# Patient Record
Sex: Male | Born: 1980 | Race: Black or African American | Hispanic: No | Marital: Married | Smoking: Current every day smoker
Health system: Southern US, Community
[De-identification: ages and names within clinical notes are randomized; demographics above are authoritative.]

---

## 2016-11-14 ENCOUNTER — Emergency Department (HOSPITAL_COMMUNITY): Payer: Worker's Compensation

## 2016-11-14 ENCOUNTER — Encounter (HOSPITAL_COMMUNITY): Payer: Self-pay | Admitting: Emergency Medicine

## 2016-11-14 ENCOUNTER — Emergency Department (HOSPITAL_COMMUNITY)
Admission: EM | Admit: 2016-11-14 | Discharge: 2016-11-14 | Disposition: A | Discharge status: Home / Self Care | Payer: Worker's Compensation | Attending: Emergency Medicine | Admitting: Emergency Medicine

## 2016-11-14 DIAGNOSIS — F17200 Nicotine dependence, unspecified, uncomplicated: Secondary | ICD-10-CM | POA: Insufficient documentation

## 2016-11-14 DIAGNOSIS — W228XXA Striking against or struck by other objects, initial encounter: Secondary | ICD-10-CM | POA: Insufficient documentation

## 2016-11-14 DIAGNOSIS — Y9383 Activity, rough housing and horseplay: Secondary | ICD-10-CM | POA: Insufficient documentation

## 2016-11-14 DIAGNOSIS — Y929 Unspecified place or not applicable: Secondary | ICD-10-CM | POA: Insufficient documentation

## 2016-11-14 DIAGNOSIS — S63502A Unspecified sprain of left wrist, initial encounter: Secondary | ICD-10-CM

## 2016-11-14 DIAGNOSIS — S6992XA Unspecified injury of left wrist, hand and finger(s), initial encounter: Secondary | ICD-10-CM | POA: Diagnosis present

## 2016-11-14 DIAGNOSIS — Y999 Unspecified external cause status: Secondary | ICD-10-CM | POA: Diagnosis not present

## 2016-11-14 NOTE — ED Provider Notes (Signed)
[facility]-EMER[facility]ENCY DEPT Provider Note   CSN: [id number] Arrival date & time: 11/14/16  1020     History   Chief Complaint Chief Complaint  Patient presents with  . Wrist Pain    left    HPI Victor Burton is a 36 y.o. male. Chief complaint is wrist pain  HPI: 36 year old male. States he was play boxing with a friend. He struck a stationary object with his left hand in a fist. He has pain on the radial side of his left wrist. It is painful to move.  History reviewed. No pertinent past medical history.  There are no active problems to display for this patient.   History reviewed. No pertinent surgical history.     Home Medications    Prior to Admission medications   Not on File    Family History No family history on file.  Social History Social History  Substance Use Topics  . Smoking status: Current Every Day Smoker  . Smokeless tobacco: Current User  . Alcohol use Yes     Allergies   Patient has no allergy information on record.   Review of Systems Review of Systems  Musculoskeletal:       Left wrist pain. No hand pain or swelling. No elbow or shoulder pain.   review of systems otherwise negative.   Physical Exam Updated Vital Signs BP (!) 145/92 (BP Location: Right Arm)   Pulse 61   Temp 98.3 F (36.8 C) (Oral)   Resp 16   Ht 6' (1.829 m)   Wt 86.2 kg (190 lb)   SpO2 96%   BMI 25.77 kg/m   Physical Exam  Musculoskeletal:  Tenderness to palpation over the dorsum of the wrist on the radial aspect. Some tenderness in the snuffbox. Full range of motion of wrist and digits. No swelling to the hand or wrist.     ED Treatments / Results  Labs (all labs ordered are listed, but only abnormal results are displayed) Labs Reviewed - No data to display  EKG  EKG Interpretation None       Radiology Dg Wrist Complete Left  Result Date: 11/14/2016 CLINICAL DATA:  Left wrist pain since yesterday with slight swelling. Fell on the wrist 2 days  ago. EXAM: LEFT WRIST - COMPLETE 3+ VIEW COMPARISON:  None. FINDINGS: There is no evidence of fracture or dislocation. There is no evidence of arthropathy or other focal bone abnormality. Soft tissues are unremarkable. IMPRESSION: Normal examination. Electronically Signed   By: Beckie Salts M.D.   On: 11/14/2016 12:01    Procedures Procedures (including critical care time)  Medications Ordered in ED Medications - No data to display   Initial Impression / Assessment and Plan / ED Course  I have reviewed the triage vital signs and the nursing notes.  Pertinent labs & imaging results that were available during my care of the patient were reviewed by me and considered in my medical decision making (see chart for details).     Exam not highly concerning for fracture other than snuffbox tenderness. X-ray of the wrist is negative. I discussed with him the possibility of an occult navicular fracture. Velcro splint was placed. Ask him to wear this and avoid lifting more than 5-10 pounds until pain free. If more than 7-10 days go by and still painful he should present for repeat x-ray to rule out occult navicular fracture.  Final Clinical Impressions(s) / ED Diagnoses   Final diagnoses:  Sprain of left wrist, initial  encounter    New Prescriptions New Prescriptions   No medications on file     Rolland Porter, MD 11/14/16 1255

## 2016-11-14 NOTE — Discharge Instructions (Signed)
Your x-rays of the wrist are negative. There is no obvious fracture. Some small bones in the wrist do not show up fractured on initial x-rays. If you still have pain in 7-10 days, you need to have a repeat x-ray of your wrist to rule out an occult fracture. Wear the splint at all times. Remove to apply ice for 20 minutes 3 times per day. No lifting over 5-10 pounds with left arm until pain free. This may take up to a week.

## 2016-11-14 NOTE — ED Triage Notes (Signed)
Pt. Stated something fell on my left wrist on Friday  And its gotten worse.

## 2016-11-22 ENCOUNTER — Encounter (HOSPITAL_COMMUNITY): Payer: Self-pay | Admitting: Emergency Medicine

## 2016-11-22 ENCOUNTER — Emergency Department (HOSPITAL_COMMUNITY)
Admission: EM | Admit: 2016-11-22 | Discharge: 2016-11-22 | Disposition: A | Discharge status: Home / Self Care | Payer: Self-pay | Attending: Emergency Medicine | Admitting: Emergency Medicine

## 2016-11-22 DIAGNOSIS — M25532 Pain in left wrist: Secondary | ICD-10-CM | POA: Insufficient documentation

## 2016-11-22 DIAGNOSIS — F17200 Nicotine dependence, unspecified, uncomplicated: Secondary | ICD-10-CM | POA: Insufficient documentation

## 2016-11-22 DIAGNOSIS — Z09 Encounter for follow-up examination after completed treatment for conditions other than malignant neoplasm: Secondary | ICD-10-CM

## 2016-11-22 NOTE — ED Provider Notes (Signed)
  [facility] Provider Note   CSN: [id number] Arrival date & time: 11/22/16  0030     History   Chief Complaint Chief Complaint  Patient presents with  . Follow-up    HPI Victor Burton is a 36 y.o. male.  Patient presents emergency department with chief complaint of needing a note for work. He states that he sprained his wrist a little over week ago. States that his work will not let him return without a note. He states that he has no pain in his wrist. He has no symptoms whatsoever.   The history is provided by the patient. No language interpreter was used.    History reviewed. No pertinent past medical history.  There are no active problems to display for this patient.   History reviewed. No pertinent surgical history.     Home Medications    Prior to Admission medications   Not on File    Family History No family history on file.  Social History Social History  Substance Use Topics  . Smoking status: Current Every Day Smoker  . Smokeless tobacco: Current User  . Alcohol use Yes     Allergies   Patient has no known allergies.   Review of Systems Review of Systems  All other systems reviewed and are negative.    Physical Exam Updated Vital Signs BP (!) 141/105 (BP Location: Right Arm)   Pulse 70   Temp 98.1 F (36.7 C) (Oral)   Resp 16   Ht 6' (1.829 m)   Wt 86.2 kg (190 lb)   SpO2 100%   BMI 25.77 kg/m   Physical Exam  Nursing note and vitals reviewed.  Constitutional: Pt appears well-developed and well-nourished. No distress.  HENT:  Head: Normocephalic and atraumatic.  Eyes: Conjunctivae are normal.  Neck: Normal range of motion.  Cardiovascular: Normal rate, regular rhythm. Intact distal pulses.   Capillary refill < 3 sec.  Pulmonary/Chest: Effort normal and breath sounds normal.  Musculoskeletal:  Left wrist Pt exhibits no ttp, specifically no snuffbox tenderness.   ROM: 5/5  Strength: 5/5  Neurological: Pt  is  alert. Coordination normal.  Sensation: 5/5 Skin: Skin is warm and dry. Pt is not diaphoretic.  No evidence of open wound or skin tenting Psychiatric: Pt has a normal mood and affect.    ED Treatments / Results  Labs (all labs ordered are listed, but only abnormal results are displayed) Labs Reviewed - No data to display  EKG  EKG Interpretation None       Radiology No results found.  Procedures Procedures (including critical care time)  Medications Ordered in ED Medications - No data to display   Initial Impression / Assessment and Plan / ED Course  I have reviewed the triage vital signs and the nursing notes.  Pertinent labs & imaging results that were available during my care of the patient were reviewed by me and considered in my medical decision making (see chart for details).     Patient with wrist injury a little over week ago. No complaints now. He needs a return to work note. He has no snuffbox tenderness. Doubt occult navicular fracture. Will discharge with return precautions as needed.  Final Clinical Impressions(s) / ED Diagnoses   Final diagnoses:  Follow up    New Prescriptions New Prescriptions   No medications on file     Roxy Horseman, Cordelia Poche 11/22/16 0217    Gilda Crease, MD 11/22/16 [phone]

## 2016-11-22 NOTE — ED Triage Notes (Signed)
Pt states he was seen here 1 week ago for a wrist sprain, states he needs a note releasing him to return to work. Denies pain/issues with wrist.

## 2016-11-22 NOTE — ED Notes (Signed)
Pt here for a work release note because his job will not allow him to return without one. The original work note does not have a return to work date.

## 2018-11-06 IMAGING — CR DG WRIST COMPLETE 3+V*L*
4 series · 4 of 4 positions shown · non-contrast
Comparison: None.

CLINICAL DATA: Left wrist pain since yesterday with slight
swelling. Fell on the wrist 2 days ago.

EXAM:
LEFT WRIST - COMPLETE 3+ VIEW

[wrist pa]
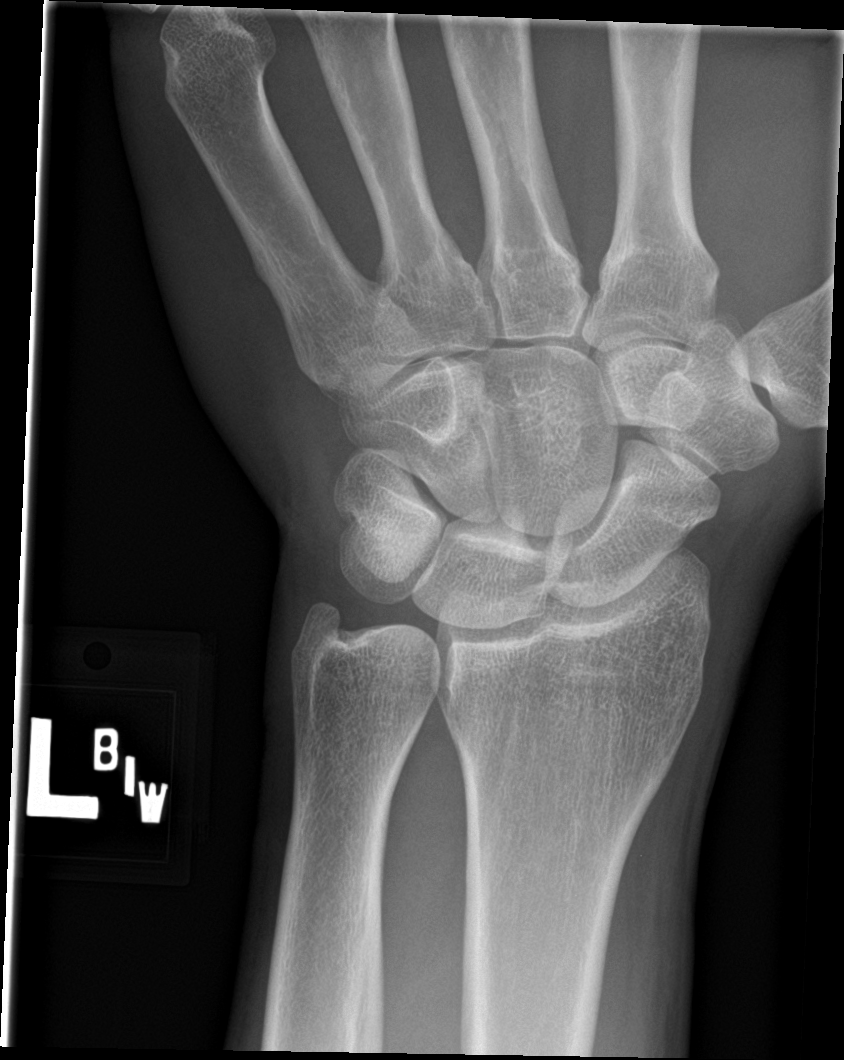

[wrist obl]
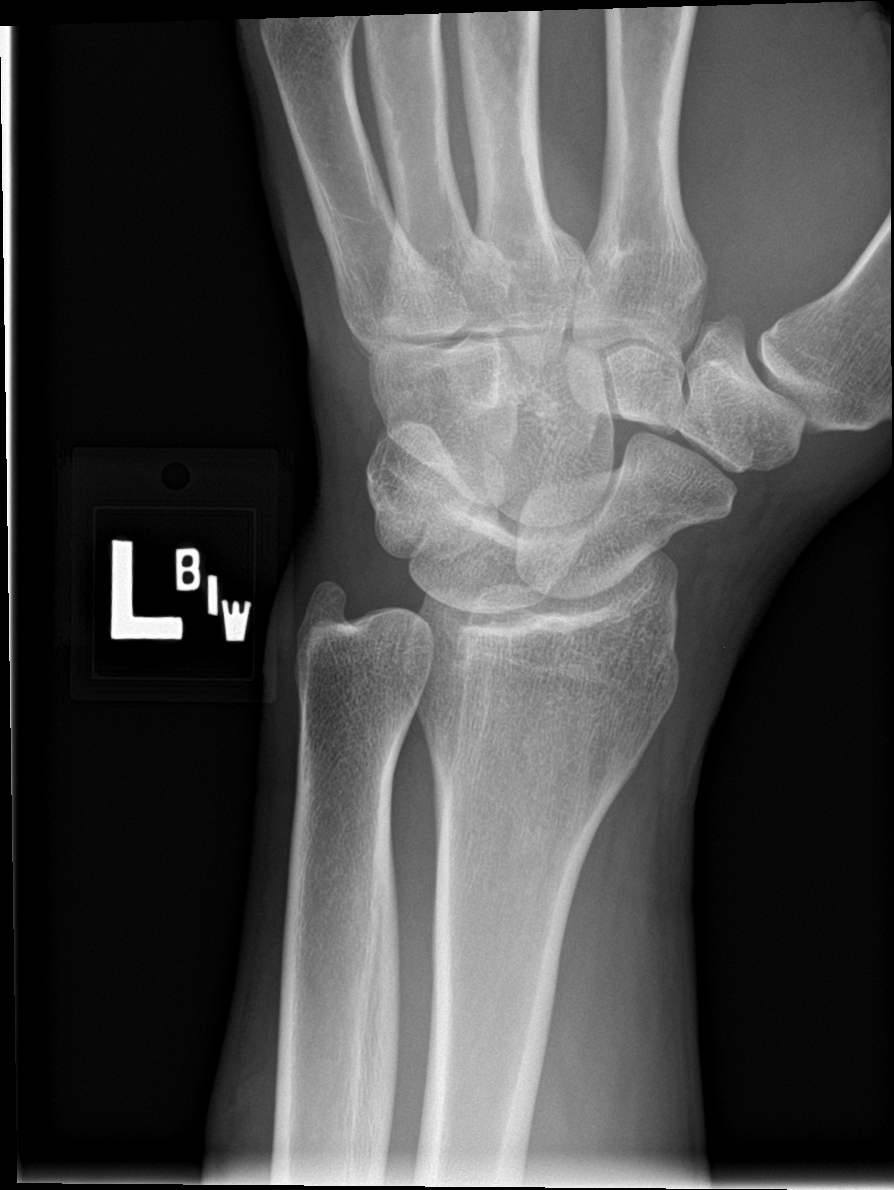

[wrist lat]
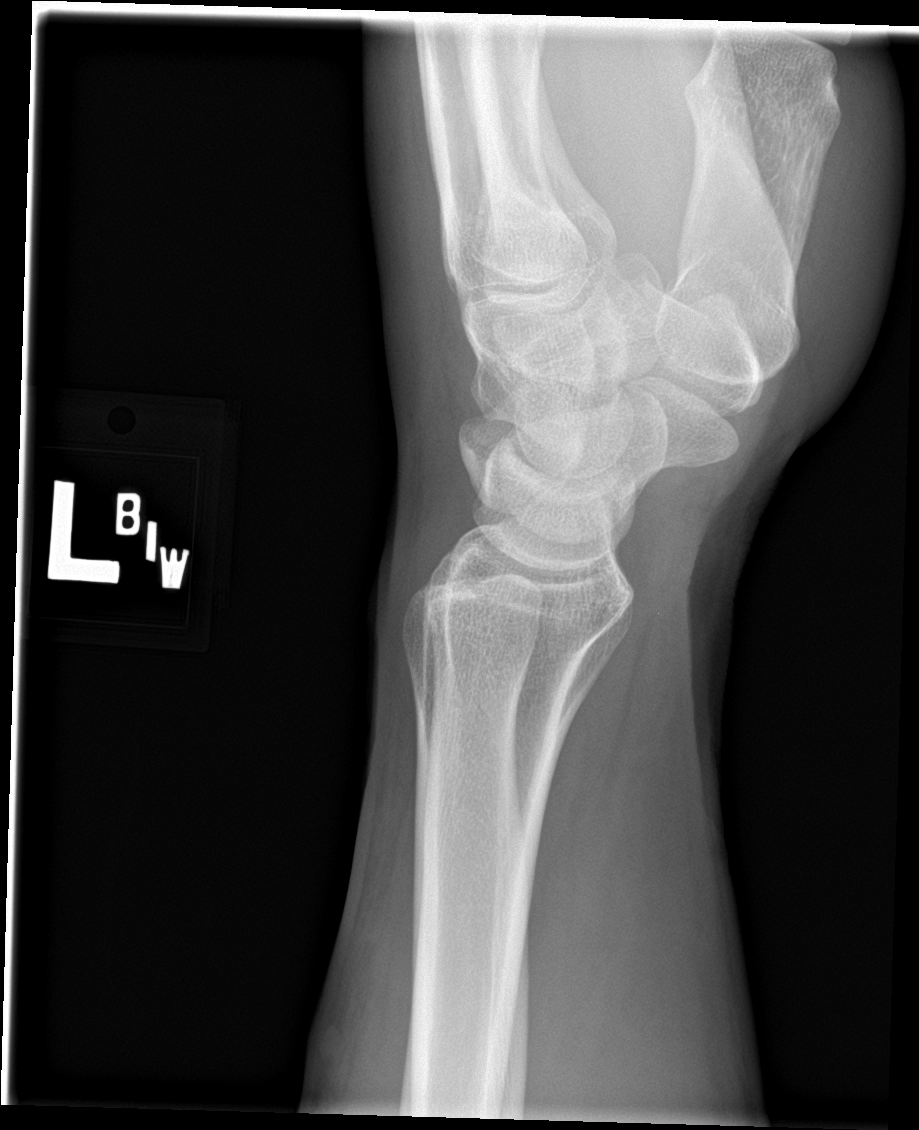

[wrist navicular]
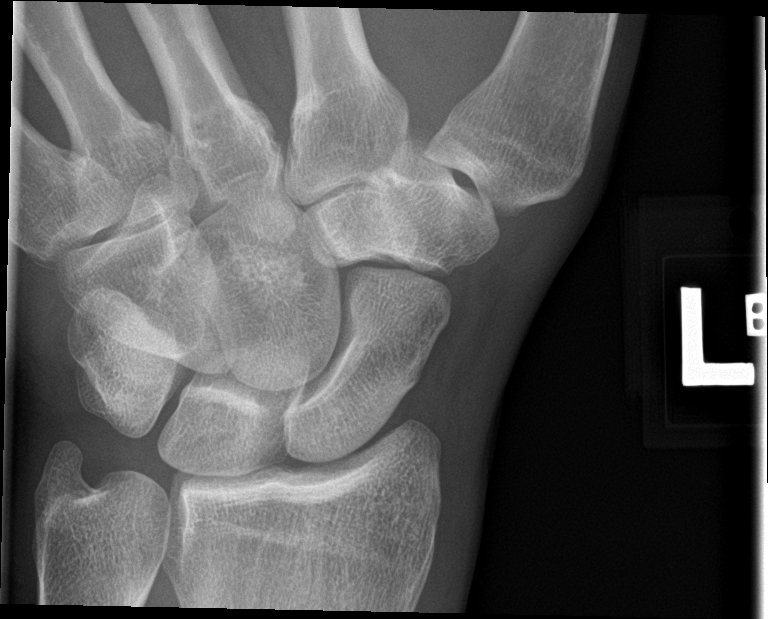

[4 of 4 positions shown; findings below may reference images not displayed]

FINDINGS: There is no evidence of fracture or dislocation. There is no
evidence of arthropathy or other focal bone abnormality. Soft
tissues are unremarkable.
IMPRESSION: Normal examination.

## 2021-01-23 ENCOUNTER — Encounter (HOSPITAL_COMMUNITY): Payer: Self-pay | Admitting: Emergency Medicine

## 2021-01-23 ENCOUNTER — Emergency Department (HOSPITAL_COMMUNITY)
Admission: EM | Admit: 2021-01-23 | Discharge: 2021-01-24 | Disposition: A | Discharge status: Home / Self Care | Payer: 59 | Attending: Emergency Medicine | Admitting: Emergency Medicine

## 2021-01-23 ENCOUNTER — Other Ambulatory Visit: Payer: Self-pay

## 2021-01-23 DIAGNOSIS — F17200 Nicotine dependence, unspecified, uncomplicated: Secondary | ICD-10-CM | POA: Diagnosis not present

## 2021-01-23 DIAGNOSIS — J101 Influenza due to other identified influenza virus with other respiratory manifestations: Secondary | ICD-10-CM | POA: Insufficient documentation

## 2021-01-23 DIAGNOSIS — Z20822 Contact with and (suspected) exposure to covid-19: Secondary | ICD-10-CM | POA: Diagnosis not present

## 2021-01-23 DIAGNOSIS — R197 Diarrhea, unspecified: Secondary | ICD-10-CM | POA: Diagnosis present

## 2021-01-23 NOTE — ED Triage Notes (Signed)
Pt here for flu-like symptoms that started this morning, pt recently travelled to [place], unknown sick contacts. Pt reports some back aches, diarrhea, HA, and chills

## 2021-01-23 NOTE — ED Provider Notes (Signed)
Emergency Medicine Provider Triage Evaluation Note  Victor Burton , a 40 y.o. male  was evaluated in triage.  Pt complains of flu-like symptoms onset this AM. Reports diarrhea, chills, headache. Notes some back ache, but has a degree of this at baseline. No medications PTA. Just travelled back to [place] from [place] after a funeral.  Review of Systems  Positive: As above Negative: As above  Physical Exam  BP (!) 133/96 (BP Location: Left Arm)   Pulse 80   Temp 98.4 F (36.9 C) (Oral)   Resp 18   SpO2 97%  Gen:   Awake, no distress   Resp:  Normal effort  MSK:   Moves extremities without difficulty  Other:  Lungs CTAB. Heart RRR.  Medical Decision Making  Medically screening exam initiated at 11:04 PM.  Appropriate orders placed.  Victor Burton was informed that the remainder of the evaluation will be completed by another provider, this initial triage assessment does not replace that evaluation, and the importance of remaining in the ED until their evaluation is complete.  Flu-like symptoms; respiratory panel pending.   Antony Madura, PA-C 01/23/21 2306    Maia Plan, MD 01/24/21 [phone]

## 2021-01-24 LAB — RESP PANEL BY RT-PCR (FLU A&B, COVID) ARPGX2
Influenza A by PCR: POSITIVE — AB
Influenza B by PCR: NEGATIVE
SARS Coronavirus 2 by RT PCR: NEGATIVE

## 2021-01-24 MED ORDER — OSELTAMIVIR PHOSPHATE 75 MG PO CAPS: 75.0000 mg | ORAL_CAPSULE | Freq: Two times a day (BID) | ORAL | 0 refills | Status: AC

## 2021-01-24 NOTE — ED Provider Notes (Signed)
[facility] EMERGENCY DEPARTMENT Provider Note   CSN: [id number] Arrival date & time: 01/23/21  2223     History Chief Complaint  Patient presents with   Chills   Diarrhea    Victor Burton is a 40 y.o. male.  Pt complains of flu-like symptoms onset this AM. Symptoms constant, unchanged. Reports diarrhea, chills, headache. Notes some back ache, but has a degree of this at baseline. No medications PTA. Just travelled back to [facility] from [place] after a funeral.  The history is provided by the patient. No language interpreter was used.  Diarrhea     History reviewed. No pertinent past medical history.  There are no problems to display for this patient.   History reviewed. No pertinent surgical history.     History reviewed. No pertinent family history.  Social History   Tobacco Use   Smoking status: Every Day   Smokeless tobacco: Current  Substance Use Topics   Alcohol use: Yes   Drug use: No    Home Medications Prior to Admission medications   Medication Sig Start Date End Date Taking? Authorizing Provider  oseltamivir (TAMIFLU) 75 MG capsule Take 1 capsule (75 mg total) by mouth every 12 (twelve) hours. 01/24/21  Yes Antony Madura, PA-C    Allergies    Patient has no known allergies.  Review of Systems   Review of Systems  Gastrointestinal:  Positive for diarrhea.  Ten systems reviewed and are negative for acute change, except as noted in the HPI.    Physical Exam Updated Vital Signs BP (!) 133/96 (BP Location: Left Arm)   Pulse 80   Temp 98.4 F (36.9 C) (Oral)   Resp 18   SpO2 97%   Physical Exam Vitals and nursing note reviewed.  Constitutional:      General: He is not in acute distress.    Appearance: He is well-developed. He is not diaphoretic.     Comments: Nontoxic appearing and in NAD  HENT:     Head: Normocephalic and atraumatic.  Eyes:     General: No scleral icterus.    Conjunctiva/sclera: Conjunctivae normal.   Cardiovascular:     Rate and Rhythm: Normal rate and regular rhythm.     Pulses: Normal pulses.  Pulmonary:     Effort: Pulmonary effort is normal. No respiratory distress.     Breath sounds: No stridor. No wheezing.     Comments: Lungs CTAB. Respirations even and unlabored. Musculoskeletal:        General: Normal range of motion.     Cervical back: Normal range of motion.  Skin:    General: Skin is warm and dry.     Coloration: Skin is not pale.     Findings: No erythema or rash.  Neurological:     Mental Status: He is alert and oriented to person, place, and time.     Coordination: Coordination normal.  Psychiatric:        Behavior: Behavior normal.    ED Results / Procedures / Treatments   Labs (all labs ordered are listed, but only abnormal results are displayed) Labs Reviewed  RESP PANEL BY RT-PCR (FLU A&B, COVID) ARPGX2 - Abnormal; Notable for the following components:      Result Value   Influenza A by PCR POSITIVE (*)    All other components within normal limits    EKG None  Radiology No results found.  Procedures Procedures   Medications Ordered in ED Medications - No data to  display  ED Course  I have reviewed the triage vital signs and the nursing notes.  Pertinent labs & imaging results that were available during my care of the patient were reviewed by me and considered in my medical decision making (see chart for details).    MDM Rules/Calculators/A&P                           Patient with symptoms consistent with influenza.  No signs of dehydration, tolerating PO's.  Lungs are clear.  Discussed the cost versus benefit of Tamiflu treatment with the patient.  The patient opts to receive this Rx today.  Also given instructions to orally hydrate, rest, and use over-the-counter medications such as anti-inflammatories ibuprofen and Aleve for muscle aches and Tylenol for fever.  Return precautions discussed and provided. Patient discharged in stable  condition with no unaddressed concerns.   Final Clinical Impression(s) / ED Diagnoses Final diagnoses:  Influenza A    Rx / DC Orders ED Discharge Orders          Ordered    oseltamivir (TAMIFLU) 75 MG capsule  Every 12 hours        01/24/21 0119             Antony Madura, PA-C 01/24/21 0300    Jacalyn Lefevre, MD 01/24/21 0401

## 2021-01-24 NOTE — Discharge Instructions (Addendum)
You are positive for Flu A today. Take tylenol or ibuprofen for fever, headaches, body aches. Drink plenty of fluids to prevent dehydration. You may continue to use other over-the-counter remedies for symptom control, if desired. Return for new or concerning symptoms such as worsening shortness of breath, coughing up blood, persistent vomiting, loss of consciousness.
